# Patient Record
Sex: Female | Born: 1985 | Race: Black or African American | Hispanic: No | Marital: Single | Smoking: Never smoker
Health system: Southern US, Community
[De-identification: ages and names within clinical notes are randomized; demographics above are authoritative.]

## PROBLEM LIST (undated history)

## (undated) DIAGNOSIS — E282 Polycystic ovarian syndrome: Secondary | ICD-10-CM

---

## 2017-04-03 ENCOUNTER — Emergency Department
Admission: EM | Admit: 2017-04-03 | Discharge: 2017-04-03 | Disposition: A | Discharge status: Home / Self Care | Payer: BLUE CROSS/BLUE SHIELD | Attending: Emergency Medicine | Admitting: Emergency Medicine

## 2017-04-03 ENCOUNTER — Emergency Department: Payer: BLUE CROSS/BLUE SHIELD

## 2017-04-03 ENCOUNTER — Encounter: Payer: Self-pay | Admitting: Emergency Medicine

## 2017-04-03 ENCOUNTER — Other Ambulatory Visit: Payer: Self-pay

## 2017-04-03 DIAGNOSIS — R1011 Right upper quadrant pain: Secondary | ICD-10-CM | POA: Diagnosis not present

## 2017-04-03 DIAGNOSIS — R112 Nausea with vomiting, unspecified: Secondary | ICD-10-CM | POA: Diagnosis not present

## 2017-04-03 DIAGNOSIS — E876 Hypokalemia: Secondary | ICD-10-CM | POA: Diagnosis not present

## 2017-04-03 DIAGNOSIS — R1013 Epigastric pain: Secondary | ICD-10-CM | POA: Diagnosis not present

## 2017-04-03 HISTORY — DX: Polycystic ovarian syndrome: E28.2

## 2017-04-03 LAB — COMPREHENSIVE METABOLIC PANEL
ALT: 359 U/L — AB (ref 14–54)
ANION GAP: 10 (ref 5–15)
AST: 352 U/L — ABNORMAL HIGH (ref 15–41)
Albumin: 3.6 g/dL (ref 3.5–5.0)
Alkaline Phosphatase: 81 U/L (ref 38–126)
BUN: 8 mg/dL (ref 6–20)
CHLORIDE: 106 mmol/L (ref 101–111)
CO2: 24 mmol/L (ref 22–32)
CREATININE: 0.64 mg/dL (ref 0.44–1.00)
Calcium: 9 mg/dL (ref 8.9–10.3)
GFR calc non Af Amer: >60 mL/min (ref >60)
Glucose, Bld: 123 mg/dL — ABNORMAL HIGH (ref 65–99)
POTASSIUM: 3.4 mmol/L — AB (ref 3.5–5.1)
SODIUM: 140 mmol/L (ref 135–145)
Total Bilirubin: 1 mg/dL (ref 0.3–1.2)
Total Protein: 7.8 g/dL (ref 6.5–8.1)

## 2017-04-03 LAB — URINALYSIS, COMPLETE (UACMP) WITH MICROSCOPIC
BACTERIA UA: NONE SEEN
Bilirubin Urine: NEGATIVE
Glucose, UA: NEGATIVE mg/dL
HGB URINE DIPSTICK: NEGATIVE
KETONES UR: NEGATIVE mg/dL
Leukocytes, UA: NEGATIVE
Nitrite: NEGATIVE
Protein, ur: NEGATIVE mg/dL
Specific Gravity, Urine: 1.024 (ref 1.005–1.030)
pH: 5 (ref 5.0–8.0)

## 2017-04-03 LAB — PREGNANCY, URINE: PREG TEST UR: NEGATIVE

## 2017-04-03 LAB — CBC
HEMATOCRIT: 39.4 % (ref 35.0–47.0)
HEMOGLOBIN: 13 g/dL (ref 12.0–16.0)
MCH: 29.6 pg (ref 26.0–34.0)
MCHC: 33.1 g/dL (ref 32.0–36.0)
MCV: 89.4 fL (ref 80.0–100.0)
Platelets: 319 10*3/uL (ref 150–440)
RBC: 4.41 MIL/uL (ref 3.80–5.20)
RDW: 14.2 % (ref 11.5–14.5)
WBC: 5.7 10*3/uL (ref 3.6–11.0)

## 2017-04-03 LAB — LIPASE, BLOOD: LIPASE: 45 U/L (ref 11–51)

## 2017-04-03 MED ORDER — ONDANSETRON 4 MG PO TBDP
ORAL_TABLET | ORAL | Status: AC
  Administered 2017-04-03: 4 mg via ORAL
  Filled 2017-04-03: qty 1

## 2017-04-03 MED ORDER — ONDANSETRON 4 MG PO TBDP
4.0000 mg | ORAL_TABLET | Freq: Once | ORAL | Status: AC
  Administered 2017-04-03: 4 mg via ORAL

## 2017-04-03 MED ORDER — KETOROLAC TROMETHAMINE 60 MG/2ML IM SOLN
INTRAMUSCULAR | Status: AC
  Administered 2017-04-03: 60 mg via INTRAMUSCULAR
  Filled 2017-04-03: qty 2

## 2017-04-03 MED ORDER — POTASSIUM CHLORIDE CRYS ER 20 MEQ PO TBCR
40.0000 meq | EXTENDED_RELEASE_TABLET | Freq: Once | ORAL | Status: AC
  Administered 2017-04-03: 40 meq via ORAL

## 2017-04-03 MED ORDER — POTASSIUM CHLORIDE CRYS ER 20 MEQ PO TBCR
EXTENDED_RELEASE_TABLET | ORAL | Status: AC
  Administered 2017-04-03: 40 meq via ORAL
  Filled 2017-04-03: qty 2

## 2017-04-03 MED ORDER — KETOROLAC TROMETHAMINE 60 MG/2ML IM SOLN
60.0000 mg | Freq: Once | INTRAMUSCULAR | Status: AC
  Administered 2017-04-03: 60 mg via INTRAMUSCULAR

## 2017-04-03 MED ORDER — ONDANSETRON 4 MG PO TBDP: 4.0000 mg | ORAL_TABLET | Freq: Three times a day (TID) | ORAL | 0 refills | Status: AC | PRN

## 2017-04-03 NOTE — ED Triage Notes (Signed)
First Nurse NOte:  Arrives with c/o abdominal pain and chest pain.  Skin warm and dry.  NAD.

## 2017-04-03 NOTE — ED Triage Notes (Signed)
Pt with abd pain and back pain started today with one episode of vomiting.

## 2017-04-03 NOTE — Discharge Instructions (Signed)
These make an appoint with your primary care physician to have your liver function tests rechecked.  Please take a clear liquid diet for the next 24 hours, then advance to a bland diet as tolerated.  You may take Zofran for nausea.  You may take Motrin for your pain; please avoid alcohol and Tylenol as your liver function may worsen.  Return to the emergency department if you develop severe pain, lightheadedness or fainting, inability to keep down fluids, fever, or any other symptoms concerning to you.

## 2017-04-03 NOTE — ED Notes (Signed)
Pt tolerating PO challenge with ginger ale.

## 2017-04-03 NOTE — ED Provider Notes (Signed)
[facility] Emergency Department Provider Note  ____________________________________________  Time seen: Approximately 6:17 PM  I have reviewed the triage vital signs and the nursing notes.   HISTORY  Chief Complaint Abdominal Pain and Back Pain    HPI Victoria Vaughn is a 32 y.o. female a history of PCO S presenting with right upper quadrant pain, nausea and vomiting.  The patient reports that she had eggs and Jell-O for breakfast, skipped lunch, and then around 2 PM she developed a sharp pain in the right upper quadrant which did not improve with positional changes.  She did not try any medications for her pain.  Upon arrival to the emergency department she had one large bout of emesis.  She has not had any constipation or diarrhea, dysuria or hematuria, fevers or chills.  The pain does radiate to her back.  Past Medical History:  Diagnosis Date  . PCOS (polycystic ovarian syndrome)     There are no active problems to display for this patient.   History reviewed. No pertinent surgical history.    Allergies Patient has no known allergies.  No family history on file.  Social History Social History   Tobacco Use  . Smoking status: Never Smoker  . Smokeless tobacco: Never Used  Substance Use Topics  . Alcohol use: No    Frequency: Never  . Drug use: No    Review of Systems Constitutional: No fever/chills.  No lightheadedness or syncope. Eyes: No visual changes. ENT: No sore throat. No congestion or rhinorrhea. Cardiovascular: Denies chest pain. Denies palpitations. Respiratory: Denies shortness of breath.  No cough. Gastrointestinal: Positive right upper quadrant greater than epigastric abdominal pain.  +nausea, +vomiting.  No diarrhea.  No constipation. Genitourinary: Negative for dysuria.  No change in vaginal discharge. Musculoskeletal: Positive for back pain. Skin: Negative for rash. Neurological: Negative for headaches. No focal  numbness, tingling or weakness.     ____________________________________________   PHYSICAL EXAM:  VITAL SIGNS: ED Triage Vitals  Enc Vitals Group     BP 04/03/17 1703 (!) 128/96     Pulse Rate 04/03/17 1703 76     Resp 04/03/17 1703 20     Temp 04/03/17 1703 98.2 F (36.8 C)     Temp Source 04/03/17 1703 Oral     SpO2 04/03/17 1703 99 %     Weight 04/03/17 1704 240 lb (108.9 kg)     Height --      Head Circumference --      Peak Flow --      Pain Score 04/03/17 1703 8     Pain Loc --      Pain Edu? --      Excl. in GC? --     Constitutional: Alert and oriented. Well appearing and in no acute distress. Answers questions appropriately. Eyes: Conjunctivae are normal.  EOMI. No scleral icterus. Head: Atraumatic. Nose: No congestion/rhinnorhea. Mouth/Throat: Mucous membranes are moist.  Neck: No stridor.  Supple.  No JVD.  No meningismus Cardiovascular: Normal rate, regular rhythm. No murmurs, rubs or gallops.  Respiratory: Normal respiratory effort.  No accessory muscle use or retractions. Lungs CTAB.  No wheezes, rales or ronchi. Gastrointestinal: Obese.  Soft, and nondistended.  Tender to palpation in the right upper quadrant greater than the epigastrium.  Positive Murphy sign.  No guarding or rebound.  No peritoneal signs. Musculoskeletal: No LE edema.  Neurologic:  A&Ox3.  Speech is clear.  Face and smile are symmetric.  EOMI.  Moves  all extremities well. Skin:  Skin is warm, dry and intact. No rash noted. Psychiatric: Mood and affect are normal. Speech and behavior are normal.  Normal judgement.  ____________________________________________   LABS (all labs ordered are listed, but only abnormal results are displayed)  Labs Reviewed  COMPREHENSIVE METABOLIC PANEL - Abnormal; Notable for the following components:      Result Value   Potassium 3.4 (*)    Glucose, Bld 123 (*)    AST 352 (*)    ALT 359 (*)    All other components within normal limits  LIPASE,  BLOOD  CBC  URINALYSIS, COMPLETE (UACMP) WITH MICROSCOPIC  POC URINE PREG, ED   ____________________________________________  EKG  Not indicated ____________________________________________  RADIOLOGY  US Abdomen Limited Ruq  Result Date: 04/03/2017 CLINICAL DATA:  Right upper quadrant pain since 2 p.m. EXAM: ULTRASOUND ABDOMEN LIMITED RIGHT UPPER QUADRANT COMPARISON:  None. FINDINGS: Gallbladder: No gallstones or wall thickening visualized. No sonographic Murphy sign noted by sonographer. Common bile duct: Diameter: 5 mm Liver: No focal lesion identified. There is diffuse increased echotexture of the liver. Portal vein is patent on color Doppler imaging with normal direction of blood flow towards the liver. IMPRESSION: Normal gallbladder. Fatty infiltration of liver. Electronically Signed   By: Sherian Rein M.D.   On: 04/03/2017 18:58    ____________________________________________   PROCEDURES  Procedure(s) performed: None  Procedures  Critical Care performed: No ____________________________________________   INITIAL IMPRESSION / ASSESSMENT AND PLAN / ED COURSE  Pertinent labs & imaging results that were available during my care of the patient were reviewed by me and considered in my medical decision making (see chart for details).  32 y.o. female, with a history of PCO S, presenting with right upper quadrant pain, nausea and vomiting.  Overall, the patient is hemodynamically stable and afebrile.  She does have right upper quadrant tenderness, and I will get a ultrasound to evaluate for gallbladder disease.  Pancreatitis is also possible but less likely as the patient denies any alcohol intake and has no known stones in the past.  Early viral GI illness is possible.  Foodborne illness is possible.  Renal colic is also possible and we will check her urinalysis for blood.  Symptomatic treatment has been initiated.  Plan reevaluation for final  disposition.  ----------------------------------------- 7:11 PM on 04/03/2017 -----------------------------------------  The patient's workup in the emergency department has been reassuring.  She continues to be afebrile and has a repeat abdominal examination which has improved compared to arrival.  Her ultrasound does not show any gallbladder disease, and her white blood cell count is normal.  She does have elevated LFTs, as well as a known history of fatty liver and has had abnormal LFTs in the past.  I am unable to compare these as the patient has no LFTs in our system today; I have encouraged her to follow-up with her primary care physician for reevaluation of her symptoms today as well as LFTs.  She will also have her potassium rechecked.  At this time, the patient is stable for discharge.  She is feeling better, she is tolerating liquid.  Return precautions as well as follow-up instructions were discussed.  ____________________________________________  FINAL CLINICAL IMPRESSION(S) / ED DIAGNOSES  Final diagnoses:  Right upper quadrant pain  Hypokalemia  Epigastric pain  Non-intractable vomiting with nausea, unspecified vomiting type         NEW MEDICATIONS STARTED DURING THIS VISIT:  New Prescriptions   No medications on file  Rockne MenghiniNorman, Anne-Caroline, MD 04/03/17 [phone]

## 2019-01-04 IMAGING — US US ABDOMEN LIMITED
1 series · 14 of 25 positions shown · non-contrast
Comparison: None.

CLINICAL DATA: Right upper quadrant pain since 2 p.m.

EXAM:
ULTRASOUND ABDOMEN LIMITED RIGHT UPPER QUADRANT

[Series 1: us abdomen limited · 0.25mm/px · 14 of 53 slices shown]
[im 1/53]
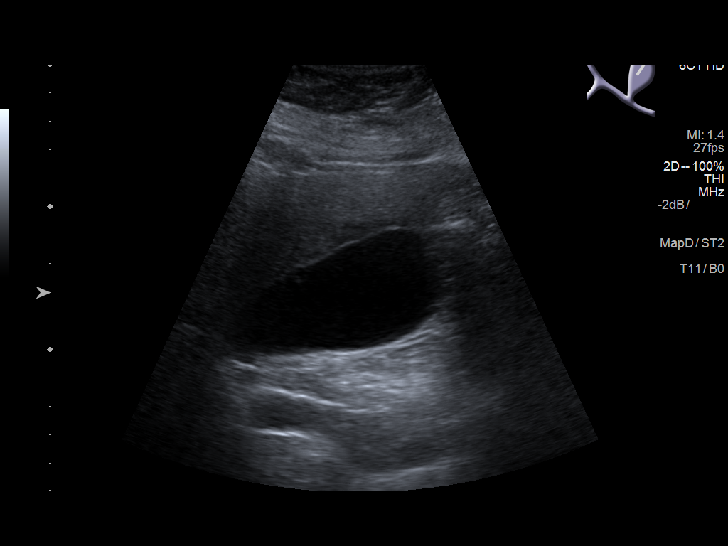
[im 5/53]
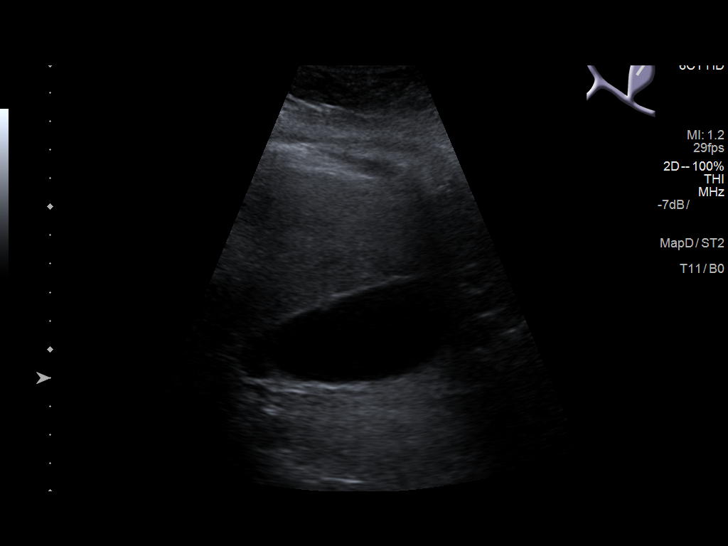
[im 9/53]
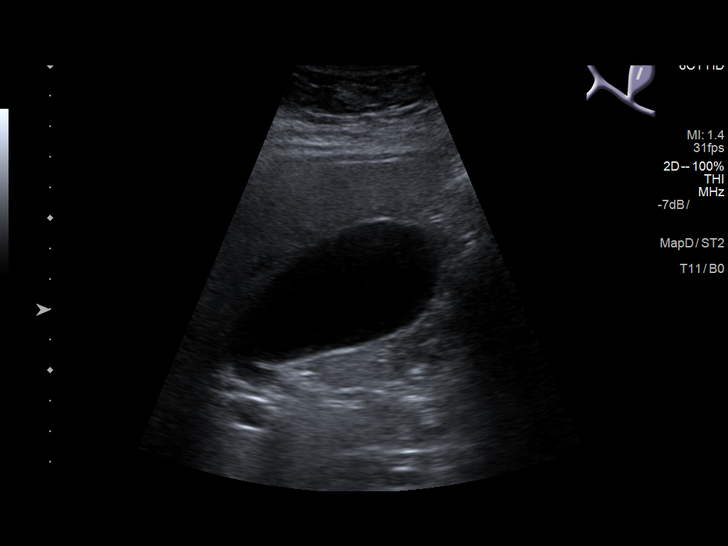
[im 14/53]
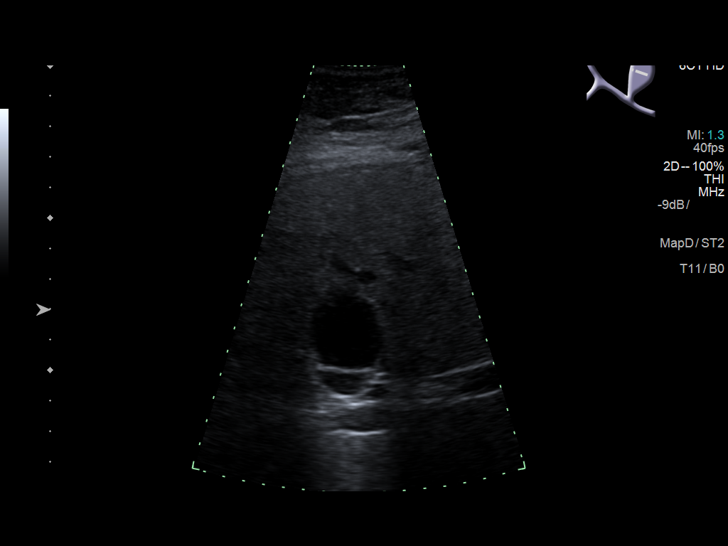
[im 18/53]
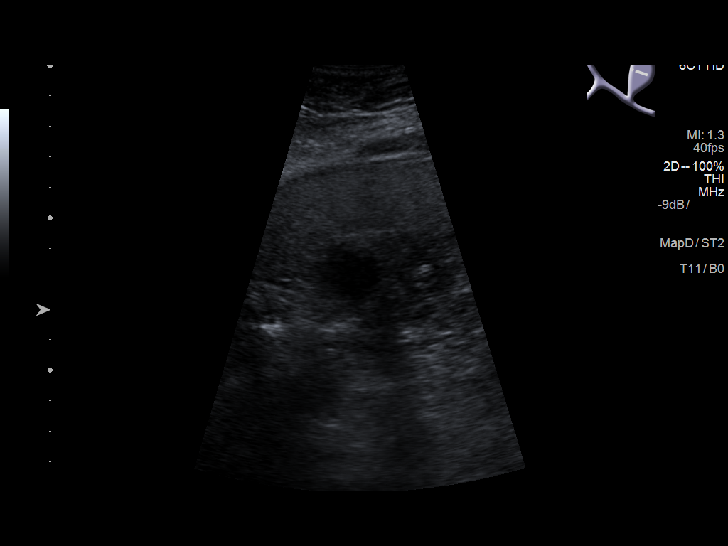
[im 20/53]
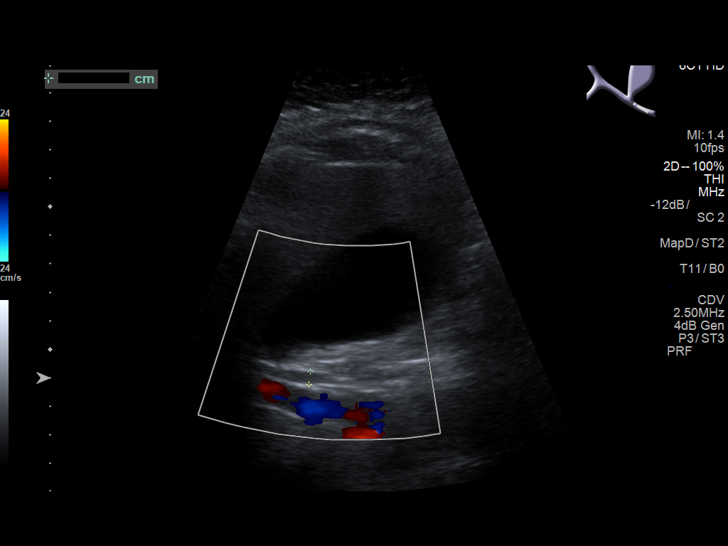
[im 24/53]
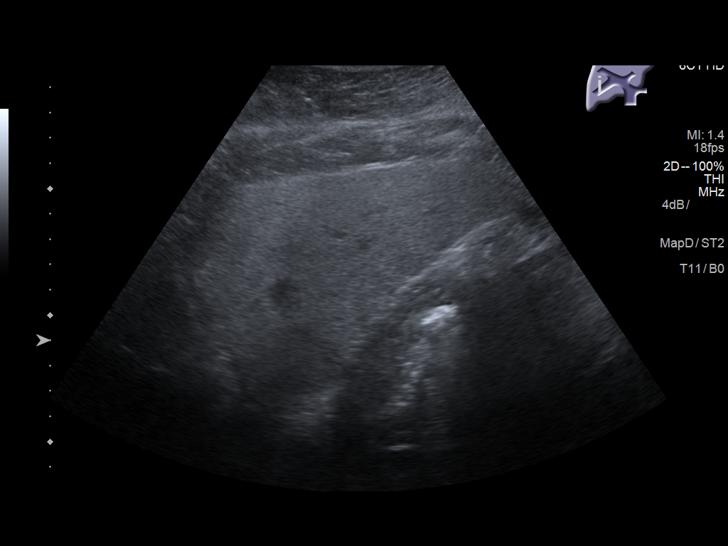
[im 29/53]
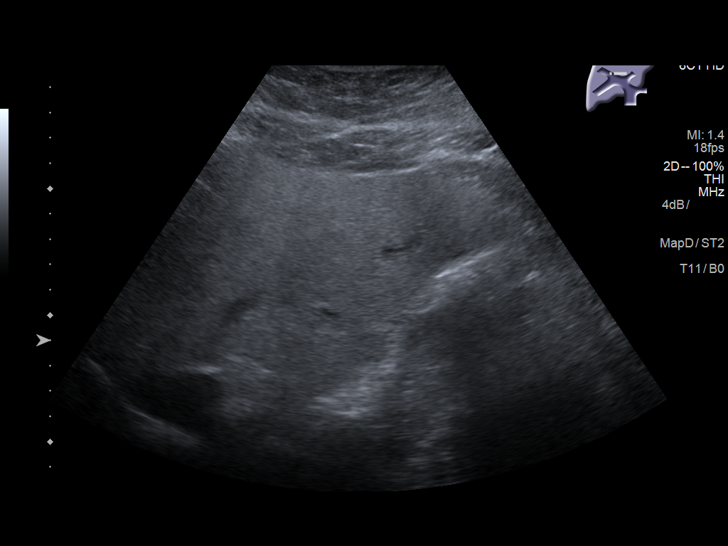
[im 33/53]
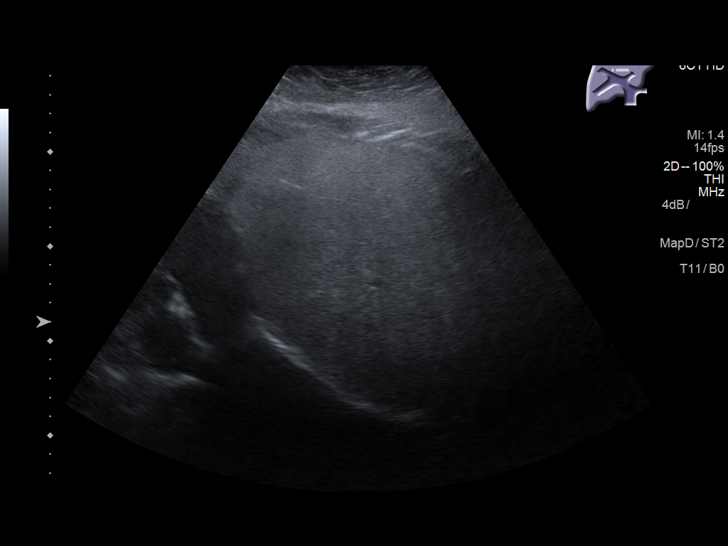
[im 35/53]
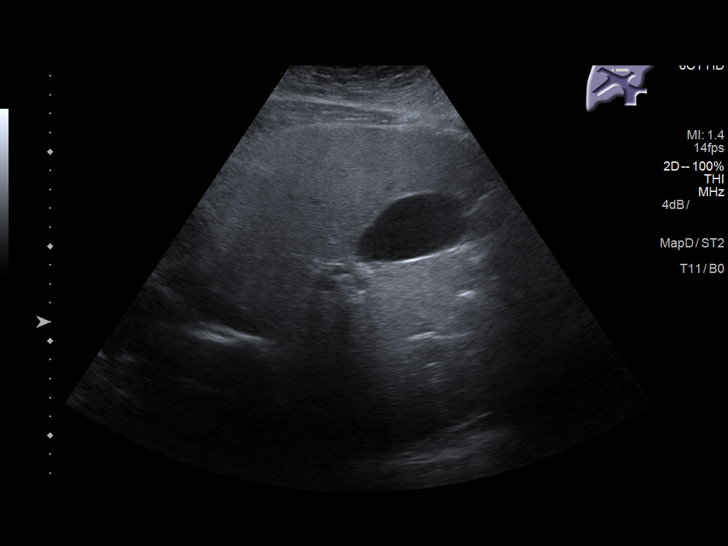
[im 40/53]
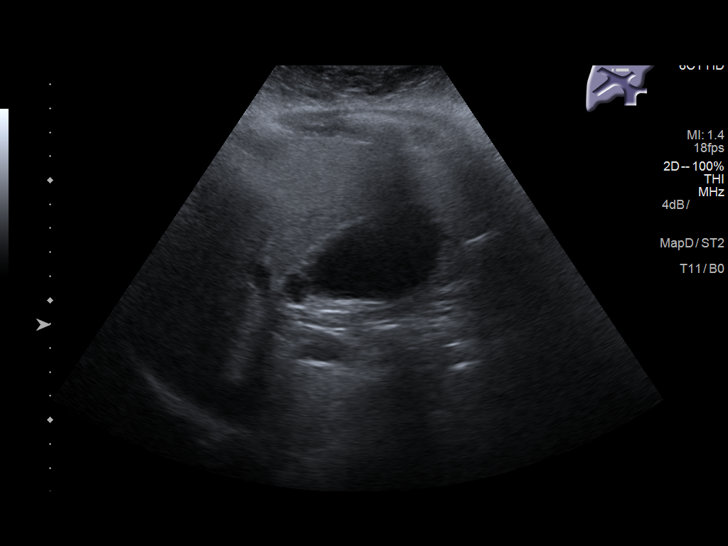
[im 44/53]
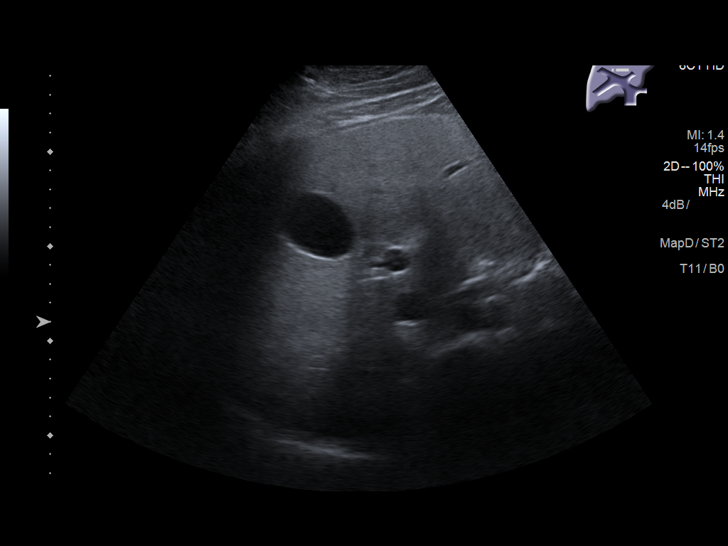
[im 48/53]
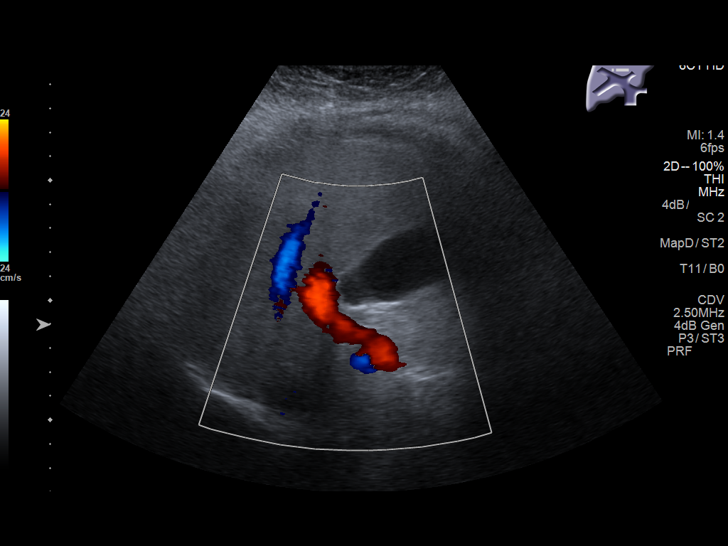
[im 53/53]
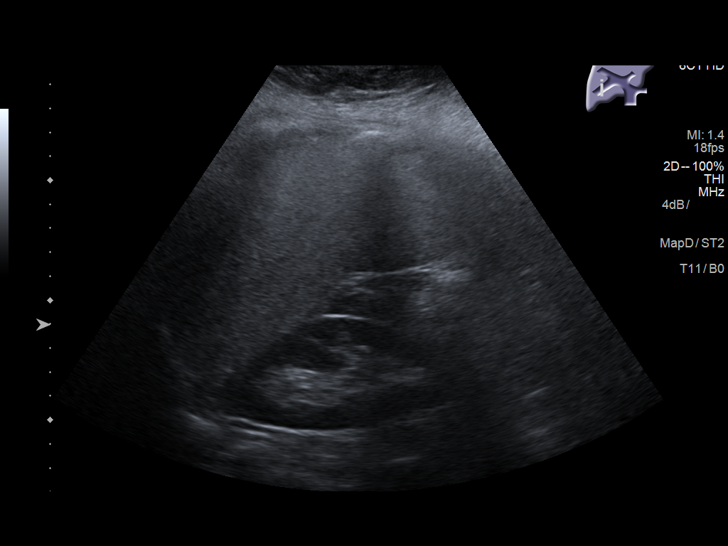

[14 of 25 positions shown; findings below may reference images not displayed]

FINDINGS: Gallbladder:

No gallstones or wall thickening visualized. No sonographic Murphy
sign noted by sonographer.

Common bile duct:

Diameter: 5 mm

Liver:

No focal lesion identified. There is diffuse increased echotexture
of the liver. Portal vein is patent on color Doppler imaging with
normal direction of blood flow towards the liver.
IMPRESSION: Normal gallbladder.

Fatty infiltration of liver.
